# Patient Record
Sex: Female | Born: 1975 | Race: Black or African American | Hispanic: No | Marital: Married
Health system: Southern US, Community
[De-identification: ages and names within clinical notes are randomized; demographics above are authoritative.]

---

## 2003-12-27 ENCOUNTER — Emergency Department: Payer: Self-pay | Admitting: Emergency Medicine

## 2005-02-13 ENCOUNTER — Emergency Department: Payer: Self-pay | Admitting: Internal Medicine

## 2007-04-26 ENCOUNTER — Ambulatory Visit: Payer: Self-pay | Admitting: Gastroenterology

## 2007-05-07 ENCOUNTER — Ambulatory Visit: Payer: Self-pay | Admitting: Gastroenterology

## 2009-10-12 IMAGING — US ABDOMEN ULTRASOUND
1 series · 17 of 25 positions shown · non-contrast
Comparison: none

REASON FOR EXAM: dyspepsia
COMMENTS:

[Series 1: abdomen ultrasound · 17 of 55 slices shown]
[im 1/55]
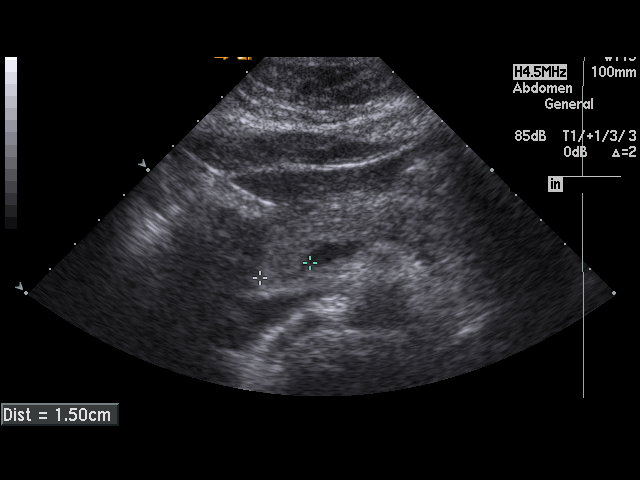
[im 5/55]
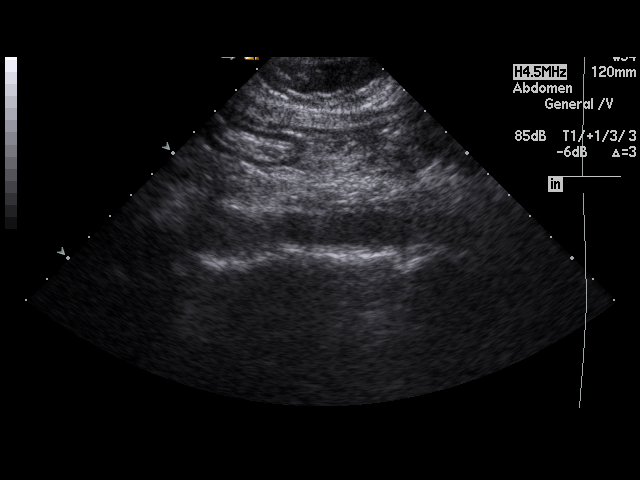
[im 7/55]
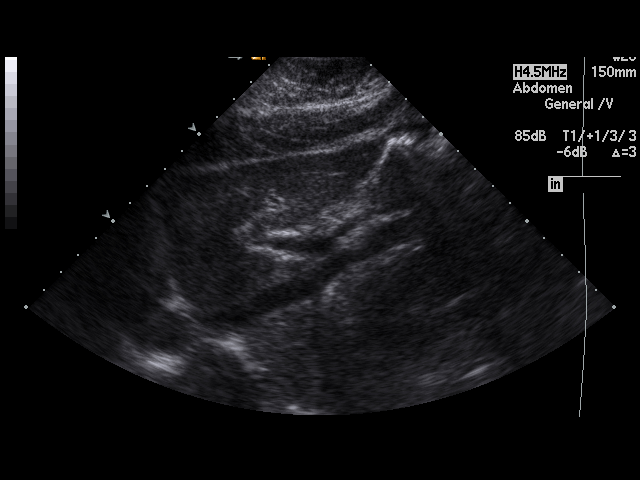
[im 12/55]
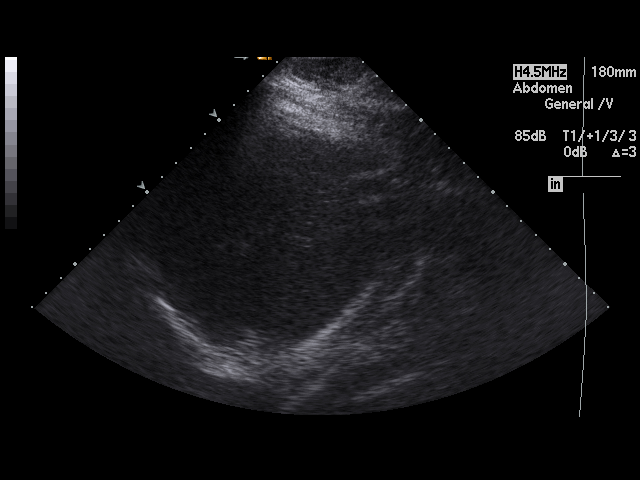
[im 14/55]
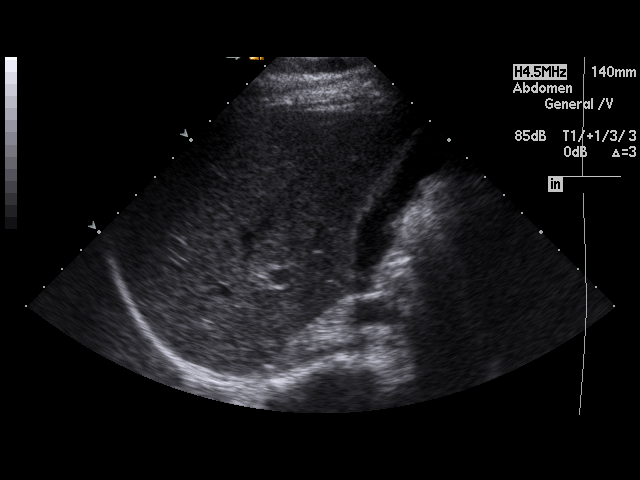
[im 19/55]
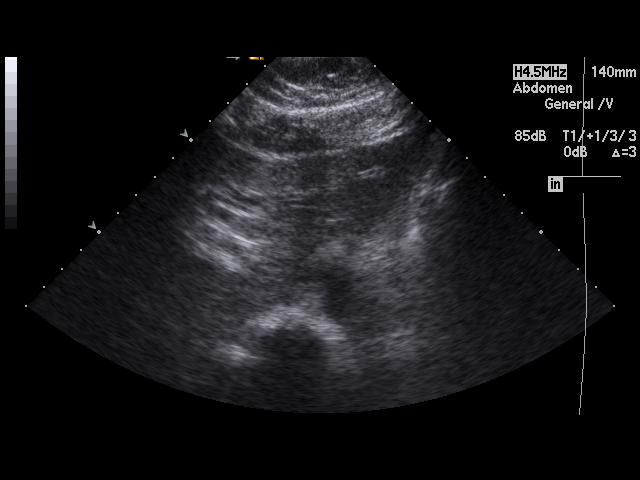
[im 21/55]
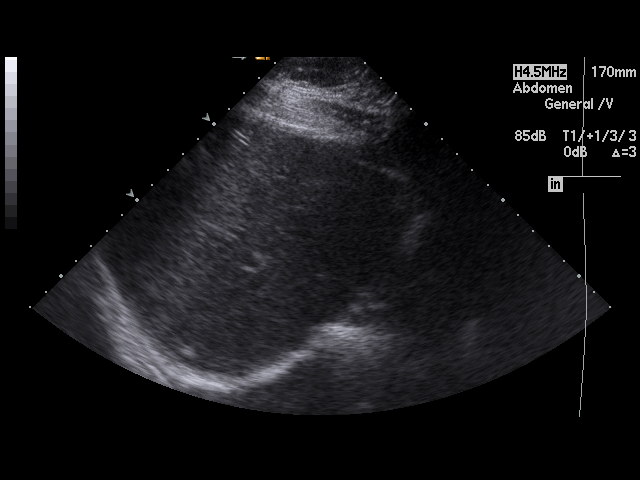
[im 25/55]
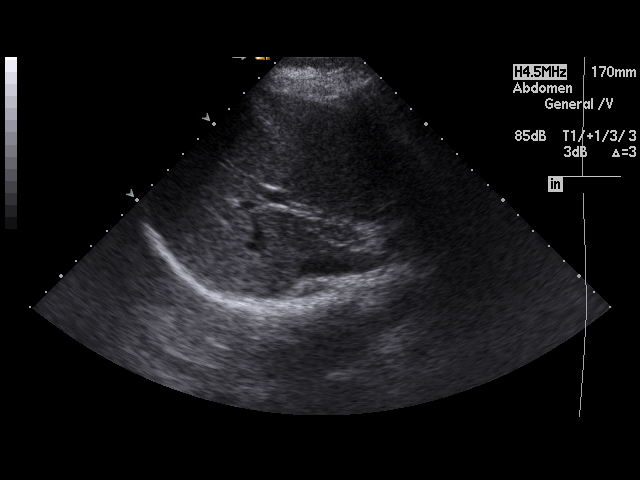
[im 28/55]
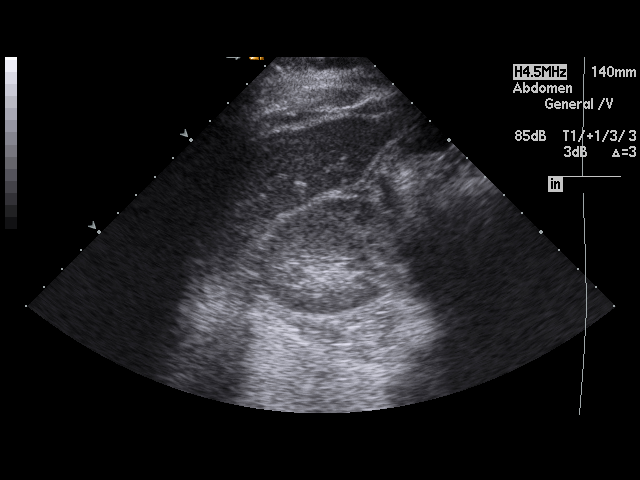
[im 30/55]
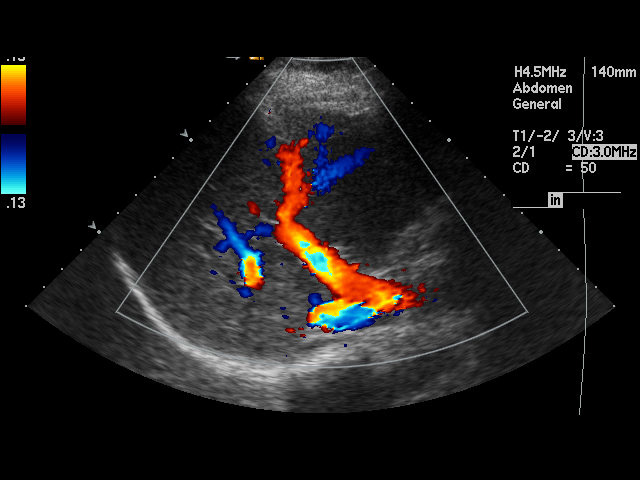
[im 34/55]
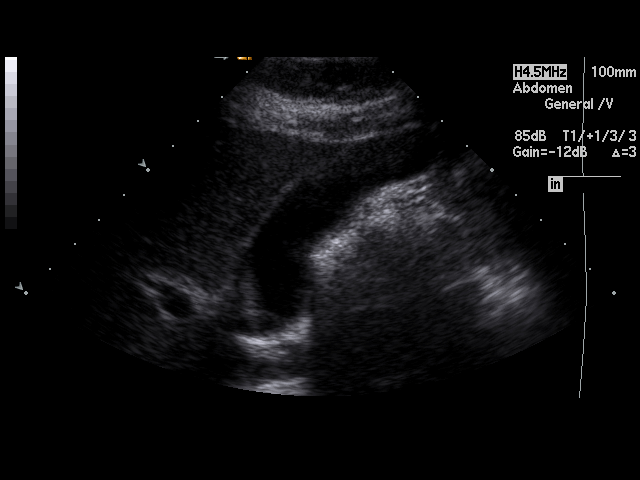
[im 37/55]
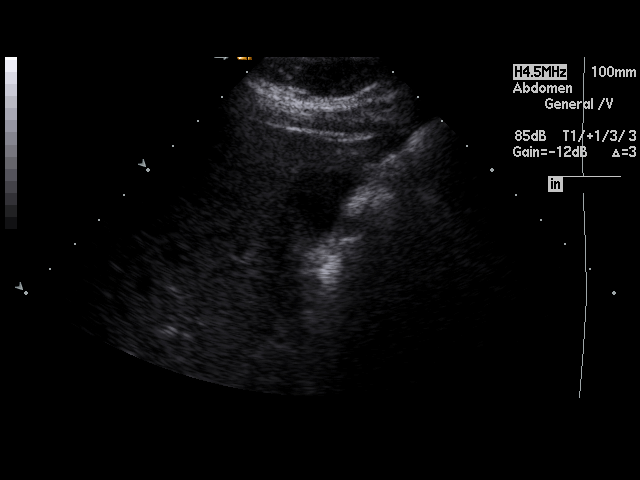
[im 41/55]
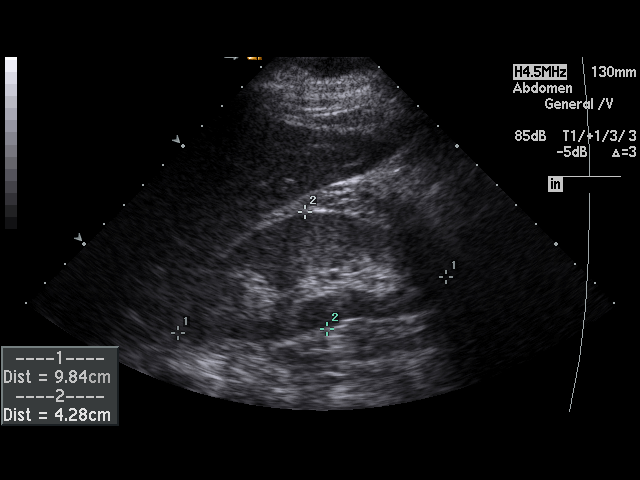
[im 43/55]
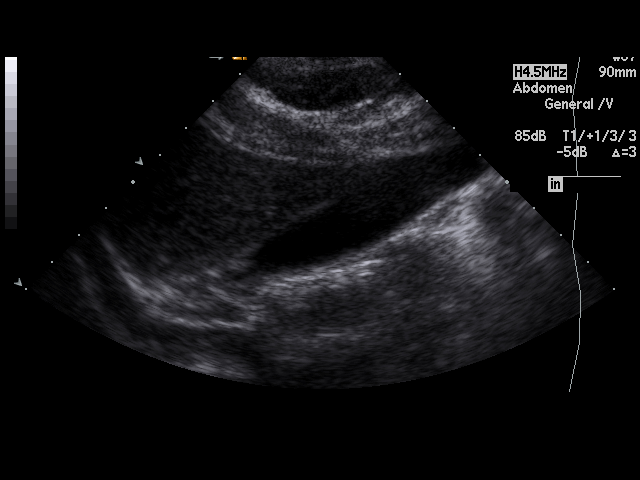
[im 48/55]
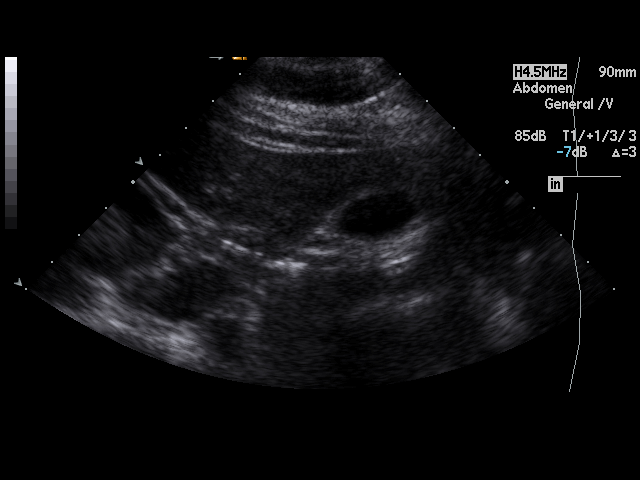
[im 50/55]
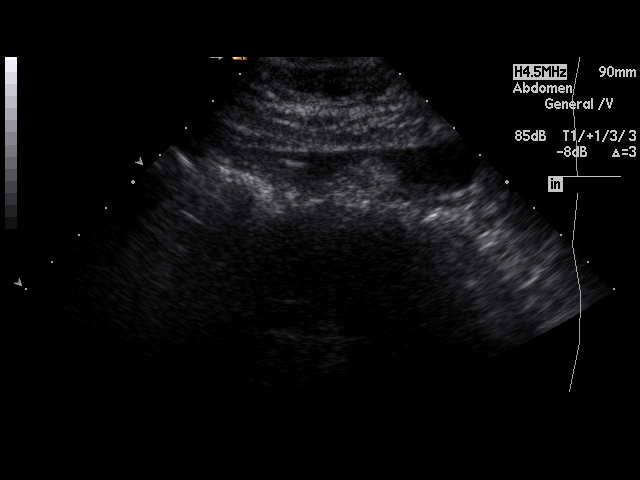
[im 55/55]
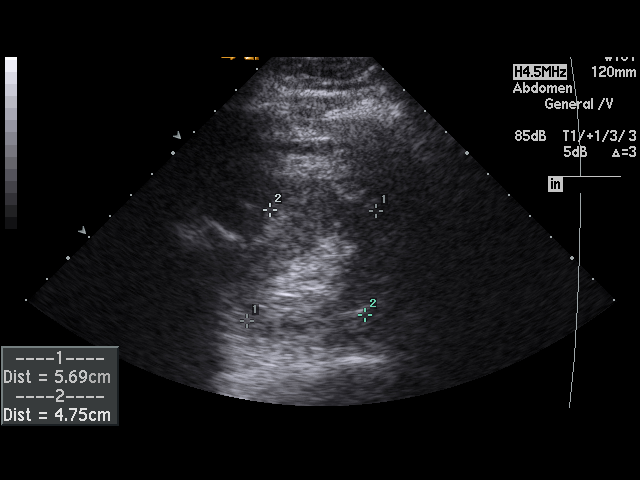

[17 of 25 positions shown; findings below may reference images not displayed]

PROCEDURE:     US  - US ABDOMEN GENERAL SURVEY  - April 26, 2007  [DATE]

RESULT:     The liver, spleen, pancreas, abdominal aorta and inferior vena
cava show no significant abnormalities. No gallstones are seen. There is no
thickening of the gallbladder wall. The common bile duct measures 3.5 mm in
diameter which is within normal limits. The kidneys show no hydronephrosis.
There is no ascites.
IMPRESSION: 1.     No significant abnormalities are noted.

## 2009-10-12 IMAGING — NM NUCLEAR MEDICINE HEPATOHBILIARY INCLUDE GB
2 series · 12 of 12 positions shown · non-contrast
Comparison: none

REASON FOR EXAM: Dyspepsia
COMMENTS:

[Series 1000: gallbladder dynamic · 4.80mm/px · 6 of 60 frames shown]
[frame 6/60]
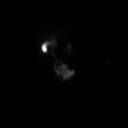
[frame 16/60]
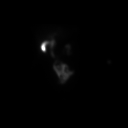
[frame 26/60]
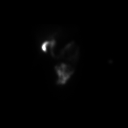
[frame 36/60]
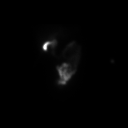
[frame 46/60]
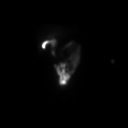
[frame 56/60]
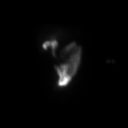

[Series 1000: gallbladder dynamic (results) · 4.80mm/px · 6 of 60 frames shown]
[frame 6/60]
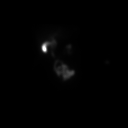
[frame 16/60]
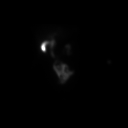
[frame 26/60]
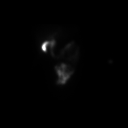
[frame 36/60]
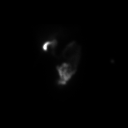
[frame 46/60]
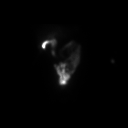
[frame 56/60]
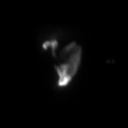

[12 of 12 positions shown; findings below may reference images not displayed]

PROCEDURE:     NM  - NM HEPATO WITH GB EJECT FRACTION  - April 26, 2007 [DATE]

RESULT:     Following intravenous administration of 7.62 mCi of Technetium
99m Choletec, there is noted prompt visualization of tracer activity in the
liver at 3 minutes. Tracer activity is visualized in the gallbladder, common
duct and proximal small bowel at 50 minutes.

The gallbladder ejection fraction at 30 minutes measures 88% which is in the
normal range.
IMPRESSION: 1.  Normal Hepatobiliary Scan.
2.  The gallbladder ejection fraction measures 88% which is in the normal
range.

## 2009-10-23 IMAGING — RF DG UGI W/ KUB
1 series · 15 of 18 positions shown · non-contrast
Comparison: none

REASON FOR EXAM: Atypical Chest Pain
COMMENTS:

[Series 1: run · 15 of 18 slices shown]
[im 1/18]
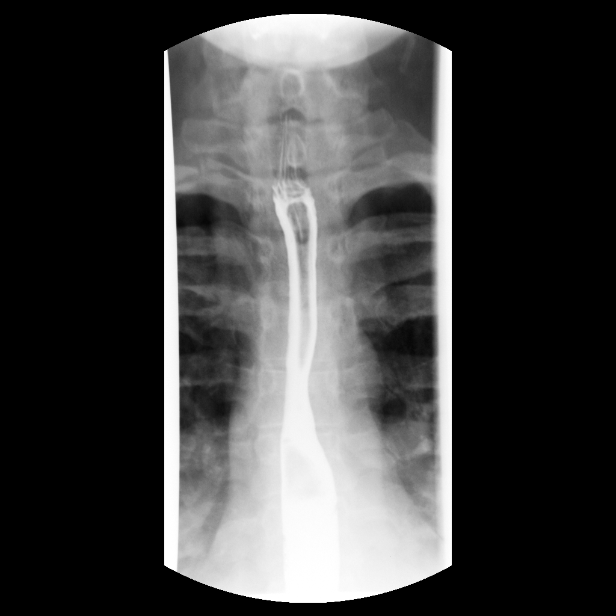
[im 2/18]
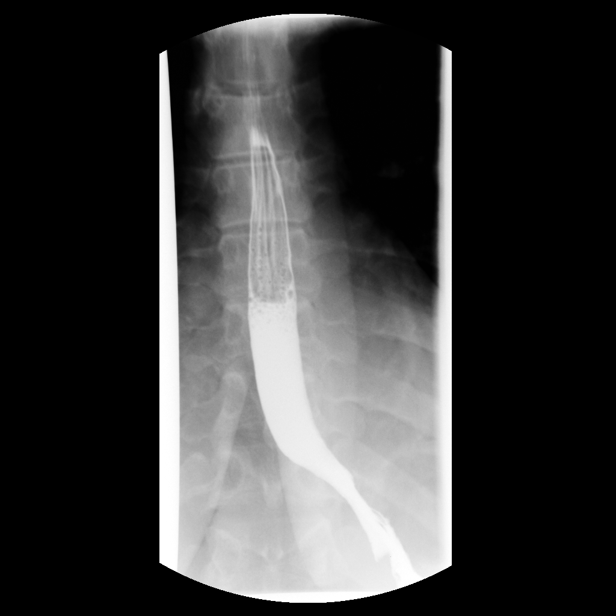
[im 4/18]
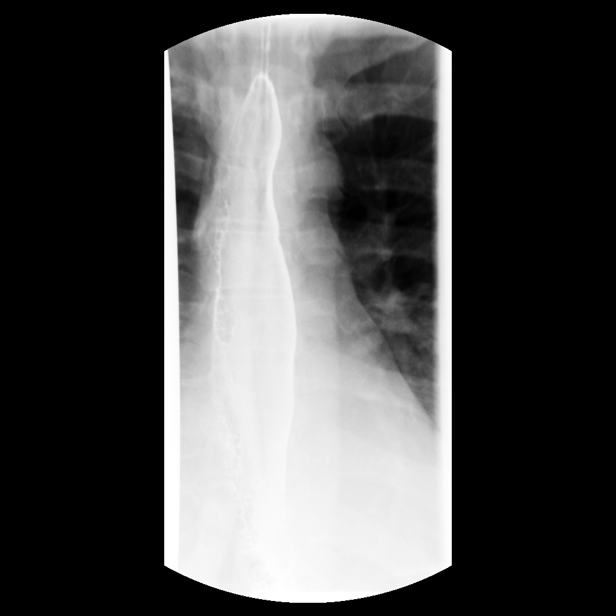
[im 5/18]
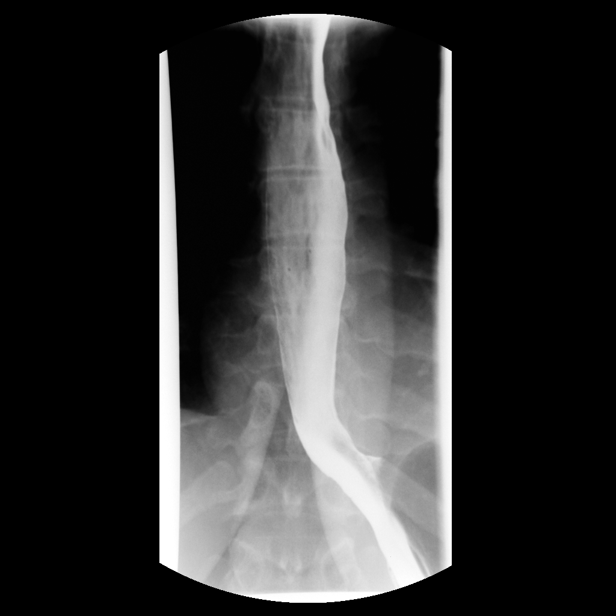
[im 6/18]
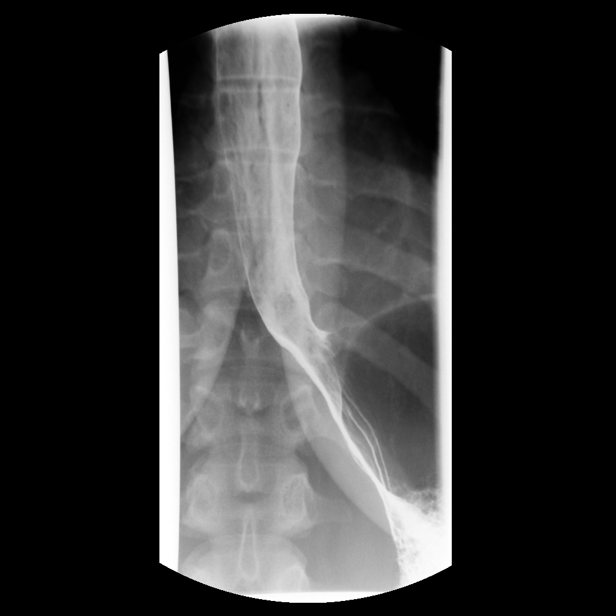
[im 7/18]
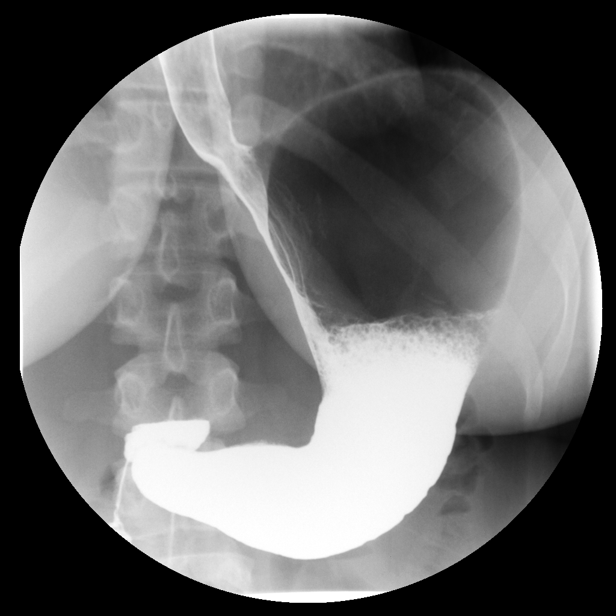
[im 8/18]
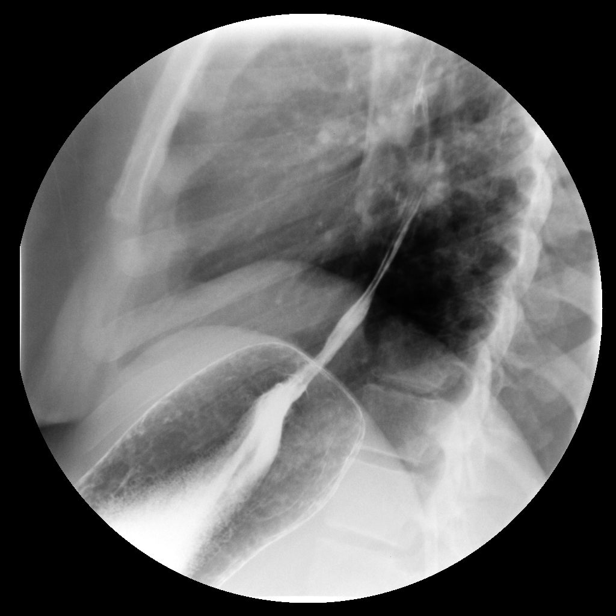
[im 10/18]
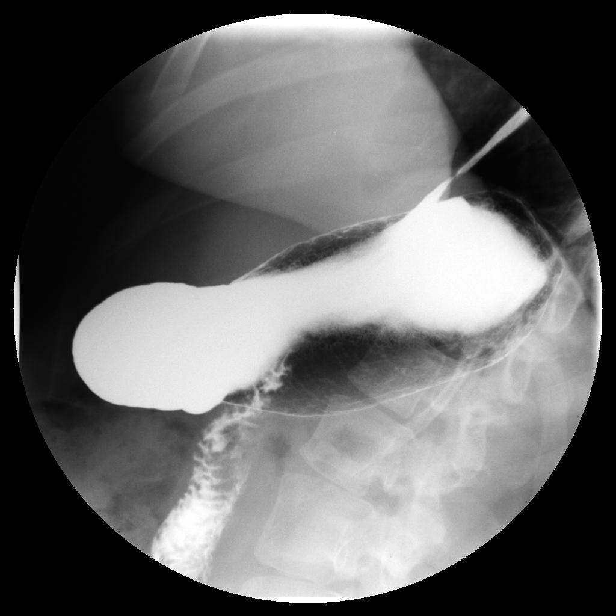
[im 11/18]
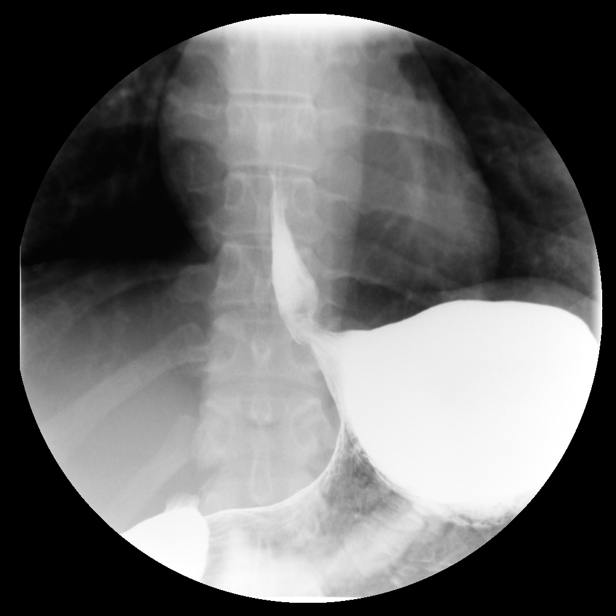
[im 12/18]
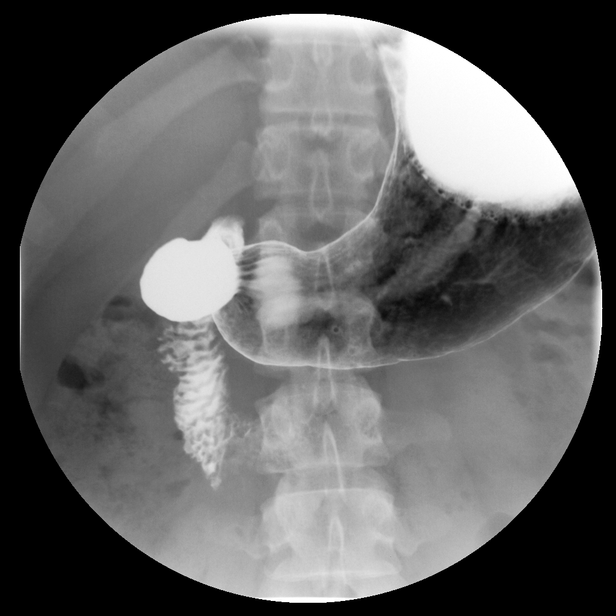
[im 13/18]
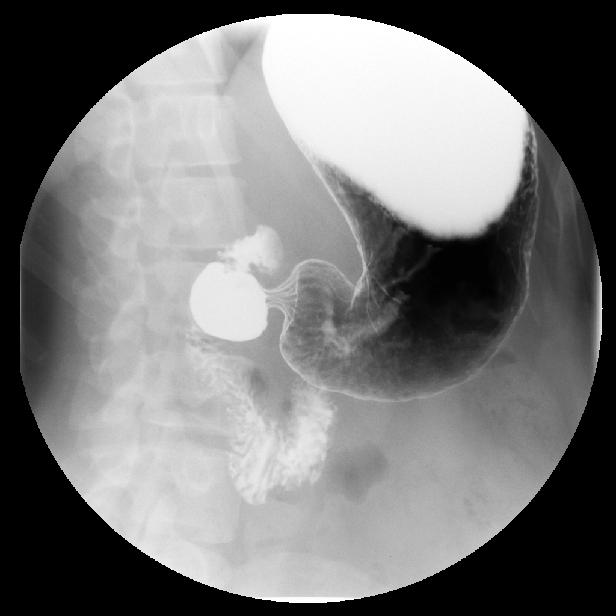
[im 14/18]
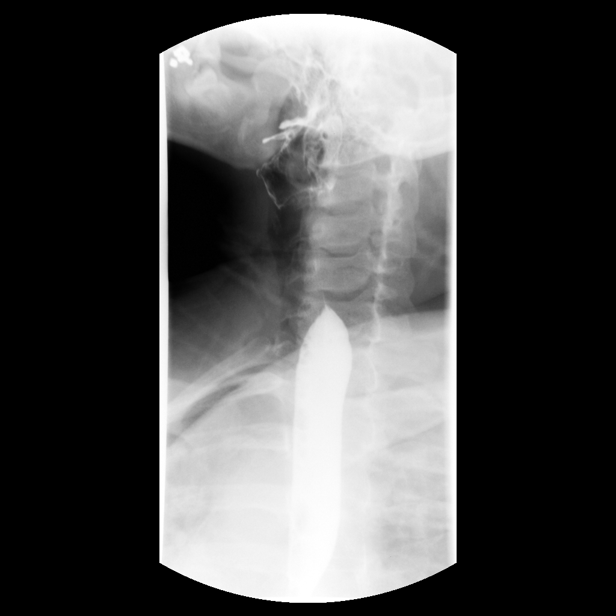
[im 16/18]
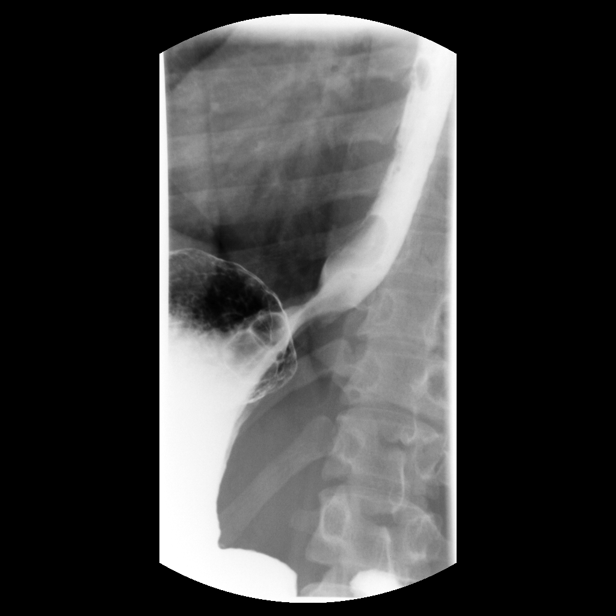
[im 17/18]
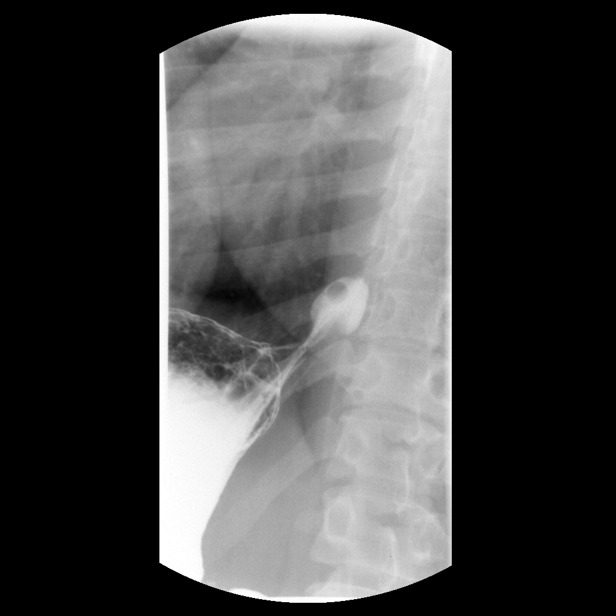
[im 18/18]
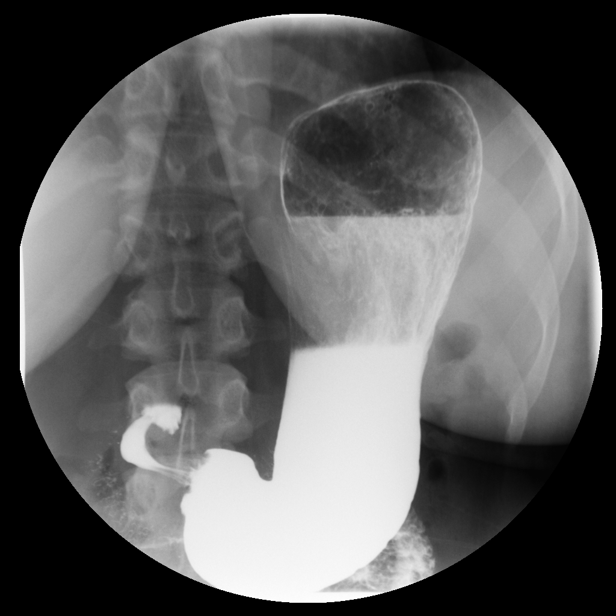

[15 of 18 positions shown; findings below may reference images not displayed]

PROCEDURE:     FL  - FL UPPER GI W/ BARIUM SWALLOW  - May 07, 2007 [DATE]

RESULT:     Upper GI with barium swallow demonstrates a normal mucosal
morphology with no stenosis. There is no hiatal hernia. The gastric and
esophageal mucosa appears to be intact. Gastroesophageal reflux is noted
into the distal esophagus only. The proximal small bowel appears to be
normal. The patient easily ingested a 12.5 mm barium impregnated tablet.
IMPRESSION: Minimal gastroesophageal reflux into the distal esophagus only. There is no
stenosis or mucosal abnormality.

## 2010-03-22 ENCOUNTER — Ambulatory Visit: Payer: Self-pay | Admitting: Obstetrics and Gynecology

## 2010-03-23 ENCOUNTER — Inpatient Hospital Stay: Payer: Self-pay

## 2010-03-25 LAB — PATHOLOGY REPORT

## 2019-03-21 ENCOUNTER — Ambulatory Visit: Payer: Self-pay | Attending: Internal Medicine

## 2019-03-21 DIAGNOSIS — Z23 Encounter for immunization: Secondary | ICD-10-CM

## 2019-03-21 NOTE — Progress Notes (Signed)
   Covid-19 Vaccination Clinic  Name:  Taylor Snow    MRN: [id number] DOB: 19-Sep-1975  03/21/2019  [name] was observed post Covid-19 immunization for 15 minutes without incident. She was provided with Vaccine Information Sheet and instruction to access the V-Safe system.Patient left without checking out, phone discharge done.  [name] was instructed to call 911 with any severe reactions post vaccine: Marland Kitchen Difficulty breathing  . Swelling of face and throat  . A fast heartbeat  . A bad rash all over body  . Dizziness and weakness   Immunizations Administered    Name Date Dose VIS Date Route   Pfizer COVID-19 Vaccine 03/21/2019  8:15 AM 0.3 mL 12/14/2018 Intramuscular   Manufacturer: [employer], [employer]   Lot: [id number]   NDC: [address]-0

## 2019-04-16 ENCOUNTER — Ambulatory Visit: Payer: Self-pay | Attending: Internal Medicine

## 2019-04-16 DIAGNOSIS — Z23 Encounter for immunization: Secondary | ICD-10-CM

## 2019-04-16 NOTE — Progress Notes (Signed)
   Covid-19 Vaccination Clinic  Name:  SAPHYRA HUTT    MRN: [id number] DOB: 15-Aug-1975  04/16/2019  Ms. Bennison was observed post Covid-19 immunization for 15 minutes without incident. She was provided with Vaccine Information Sheet and instruction to access the V-Safe system.   Ms. Gadsby was instructed to call 911 with any severe reactions post vaccine: Marland Kitchen Difficulty breathing  . Swelling of face and throat  . A fast heartbeat  . A bad rash all over body  . Dizziness and weakness   Immunizations Administered    Name Date Dose VIS Date Route   Pfizer COVID-19 Vaccine 04/16/2019  8:26 AM 0.3 mL 12/14/2018 Intramuscular   Manufacturer: [employer], [employer]   Lot: [id number]   NDC: [address]-2
